# Patient Record
Sex: Female | Born: 1985
Health system: Southern US, Community
[De-identification: ages and names within clinical notes are randomized; demographics above are authoritative.]

---

## 2017-07-23 DIAGNOSIS — B002 Herpesviral gingivostomatitis and pharyngotonsillitis: Secondary | ICD-10-CM

## 2017-07-23 NOTE — Progress Notes (Signed)
Oral herpes outbreak

## 2019-03-22 IMAGING — DX DG CHEST 2V
2 series · 2 of 2 positions shown · non-contrast
Comparison: None.

CLINICAL DATA: Chest tightness and mild shortness of breath. Known
lupus.

EXAM:
CHEST - 2 VIEW

[chest pa]
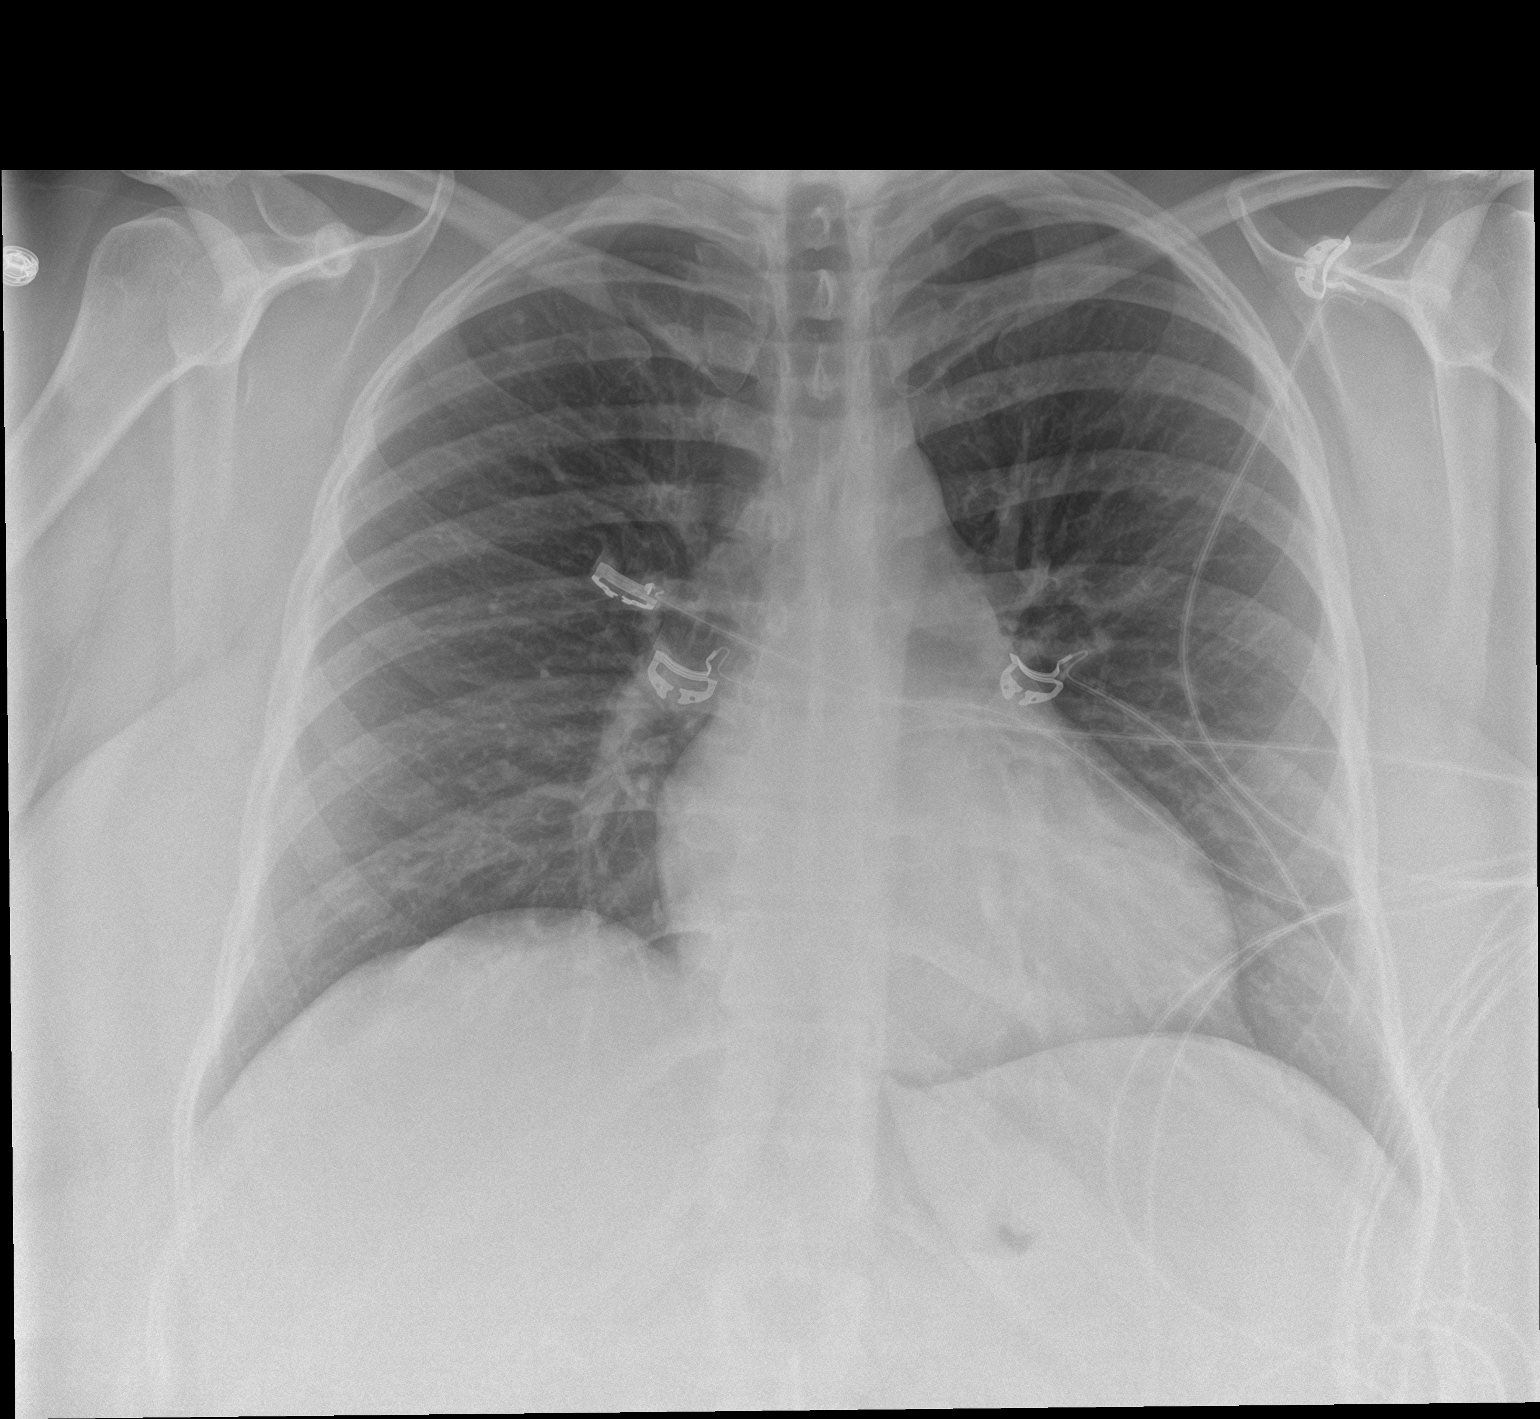

[chest lat]
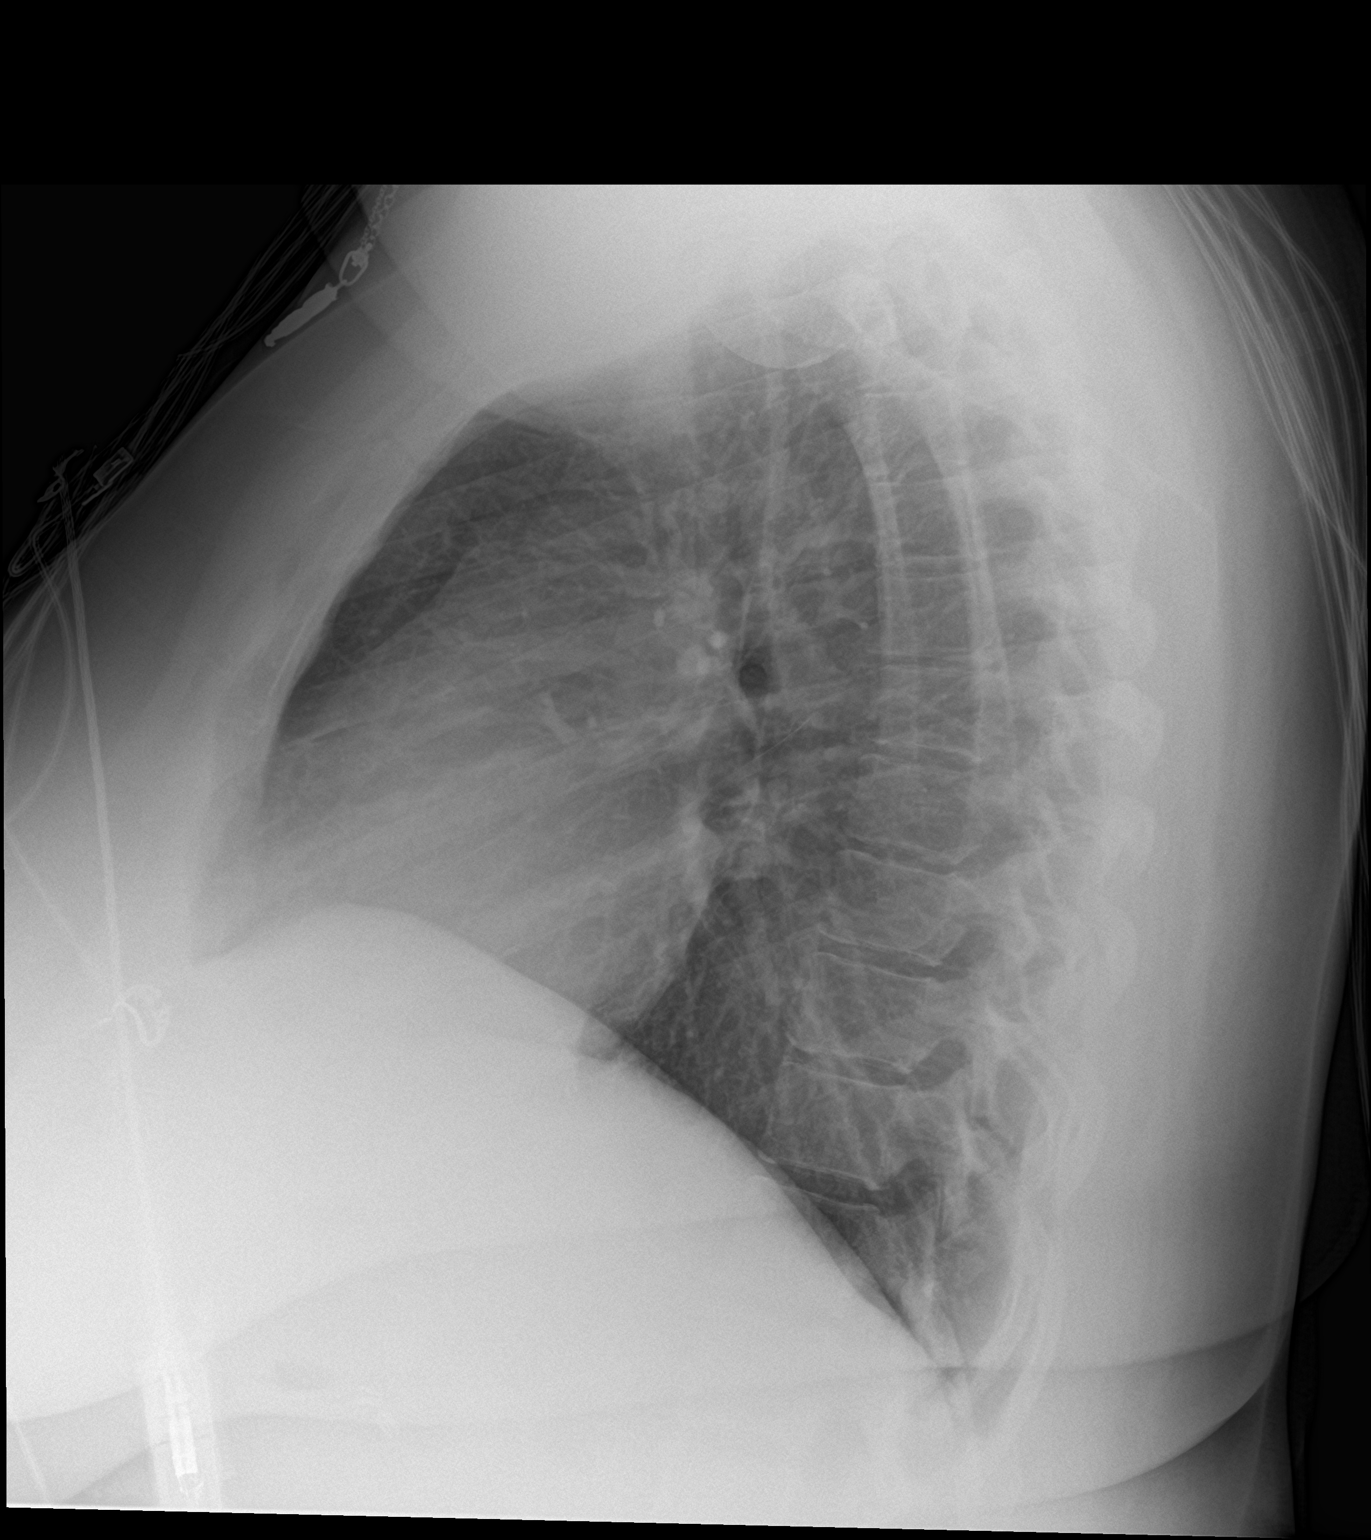

[2 of 2 positions shown; findings below may reference images not displayed]

FINDINGS: The heart size and mediastinal contours are within normal limits.
Both lungs are clear. The visualized skeletal structures are
unremarkable.
IMPRESSION: No active cardiopulmonary disease.

## 2019-03-22 IMAGING — US US EXTREM LOW VENOUS*L*
1 series · 13 of 24 positions shown · non-contrast
Comparison: None.

CLINICAL DATA: Left thigh pain, edema and erythema.



[Series 1: us extrem low venous*left* · 13 of 40 slices shown]
[im 1/40]
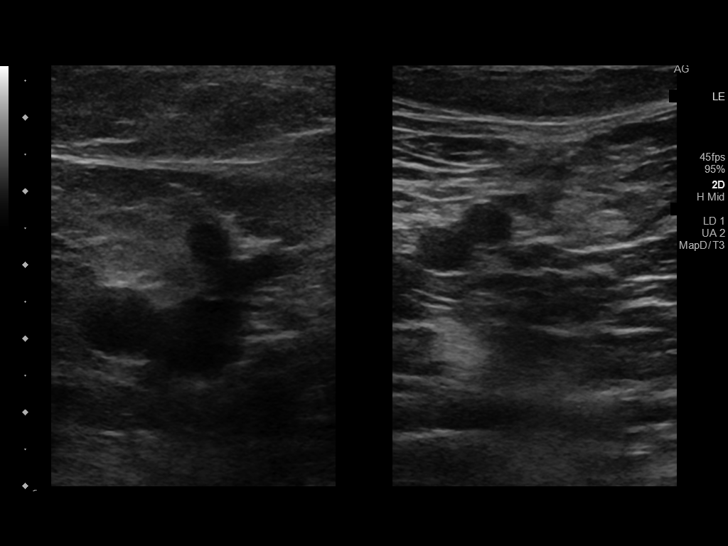
[im 4/40]
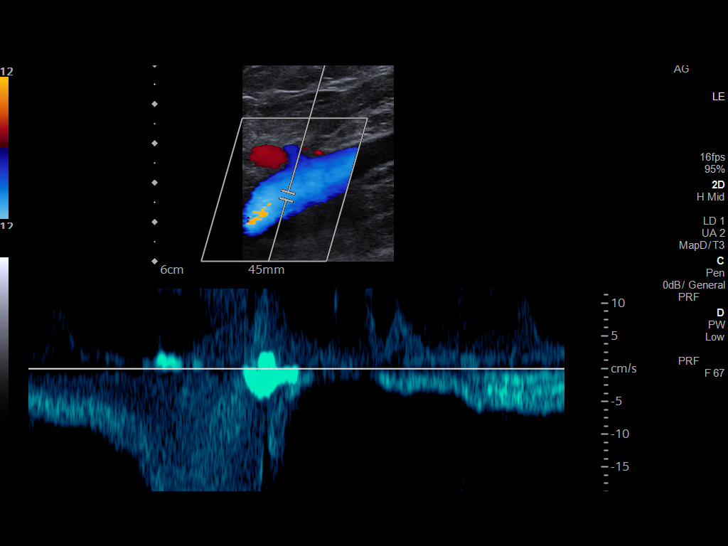
[im 7/40]
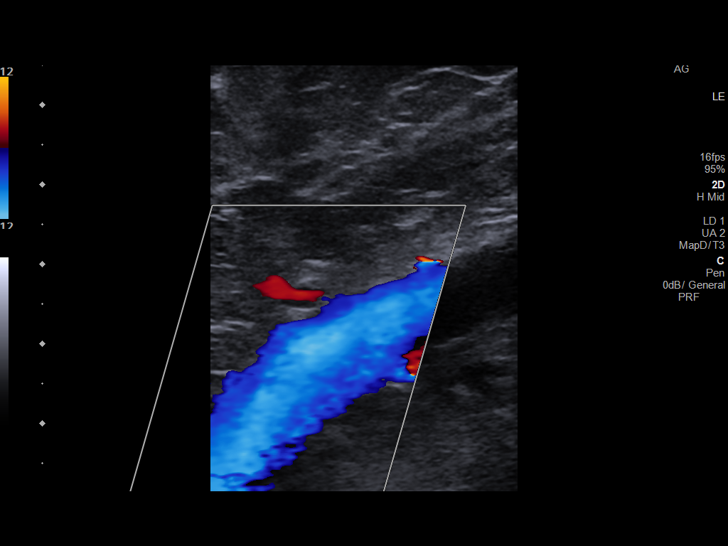
[im 11/40]
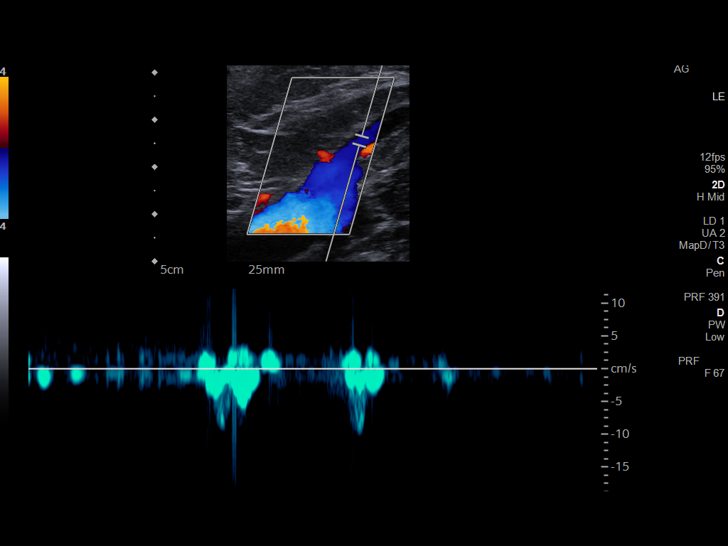
[im 14/40]
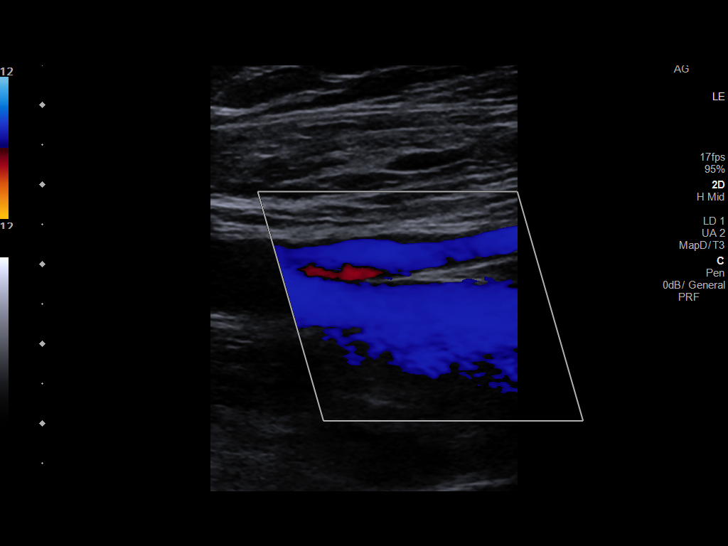
[im 17/40]
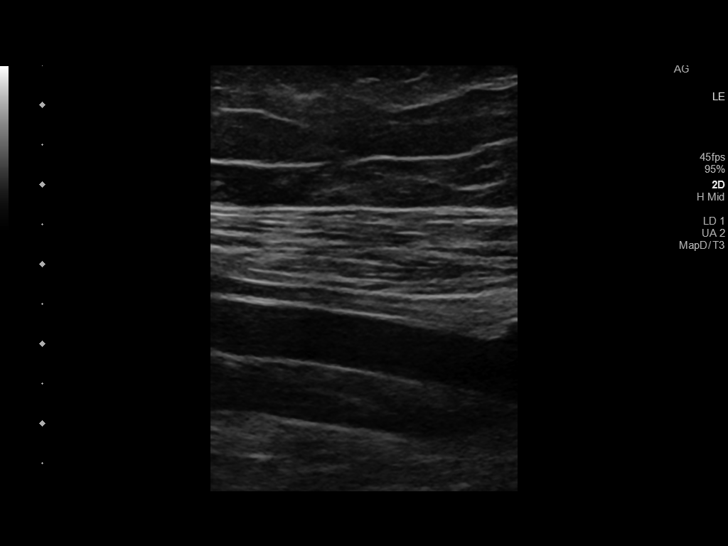
[im 21/40]
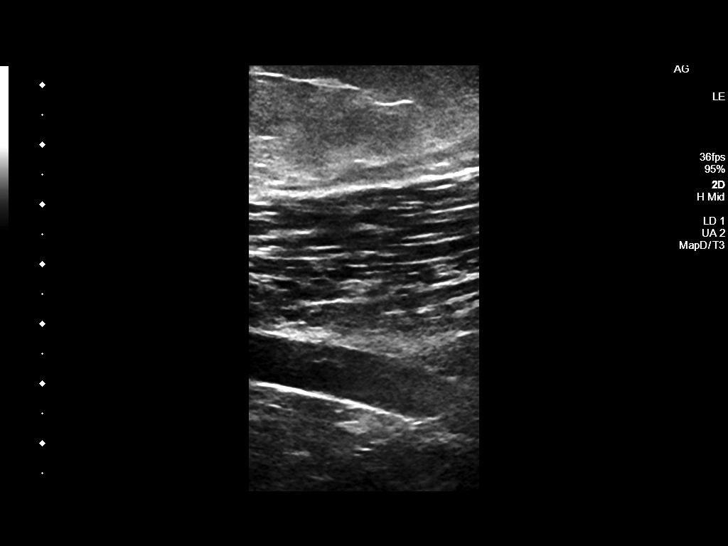
[im 23/40]
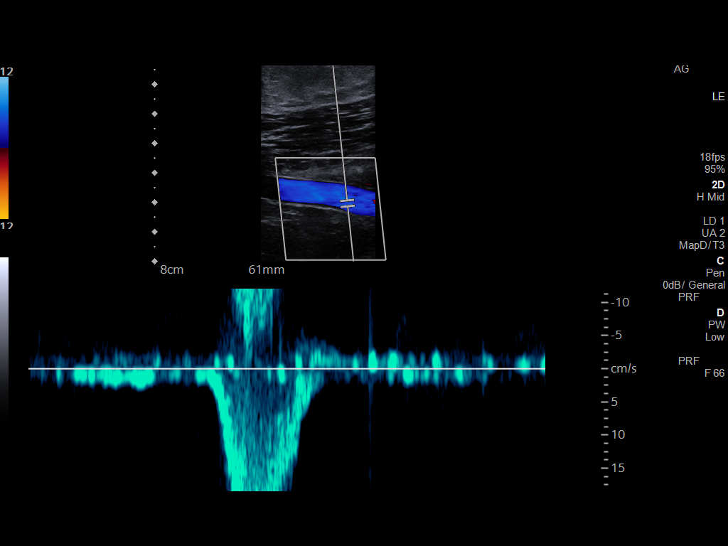
[im 26/40]
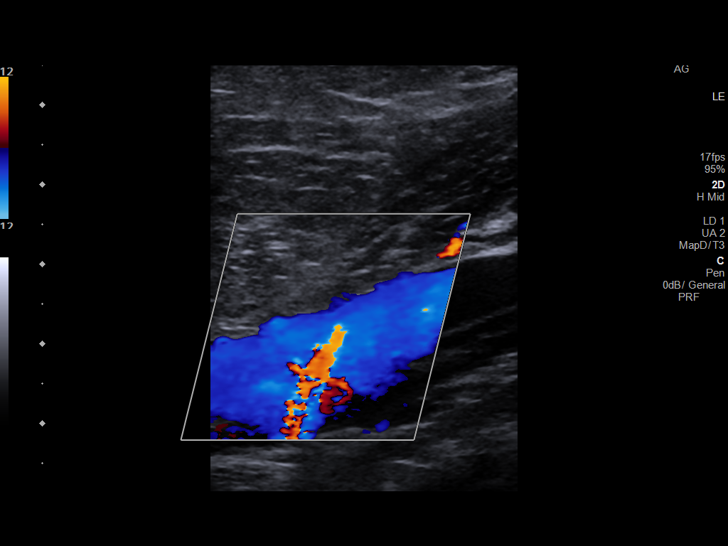
[im 29/40]
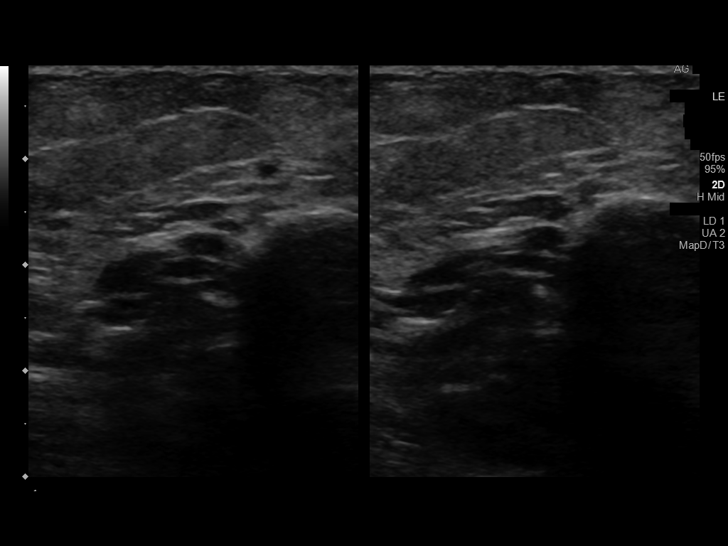
[im 33/40]
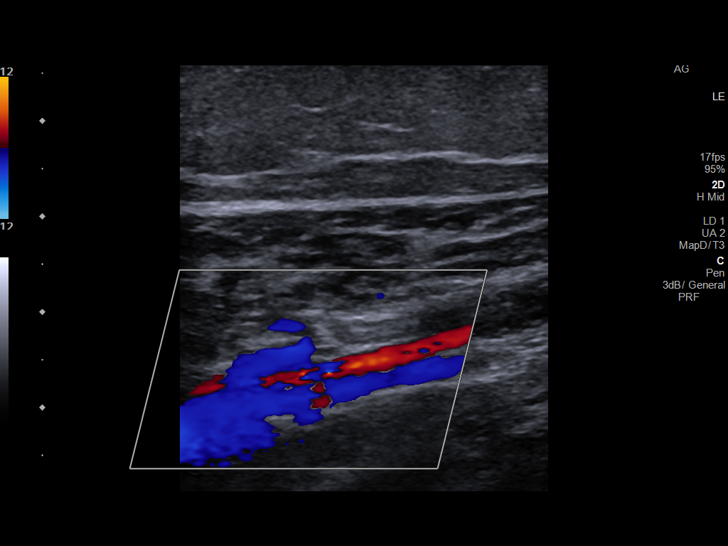
[im 36/40]
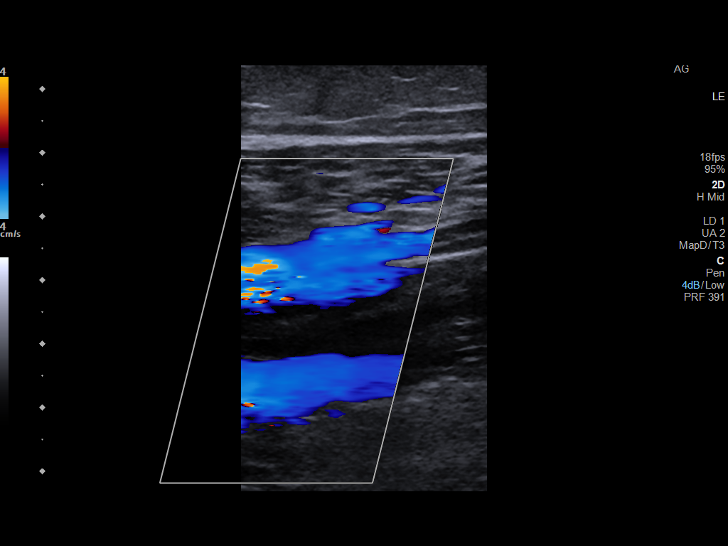
[im 40/40]
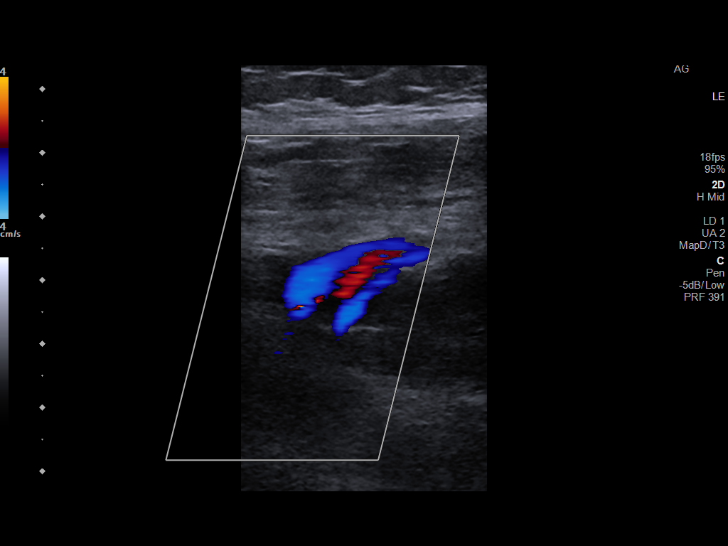

[13 of 24 positions shown; findings below may reference images not displayed]

FINDINGS: Contralateral Common Femoral Vein: Respiratory phasicity is normal
and symmetric with the symptomatic side. No evidence of thrombus.
Normal compressibility.

Common Femoral Vein: No evidence of thrombus. Normal
compressibility, respiratory phasicity and response to augmentation.

Saphenofemoral Junction: No evidence of thrombus. Normal
compressibility and flow on color Doppler imaging.

Profunda Femoral Vein: No evidence of thrombus. Normal
compressibility and flow on color Doppler imaging.

Femoral Vein: No evidence of thrombus. Normal compressibility,
respiratory phasicity and response to augmentation.

Popliteal Vein: No evidence of thrombus. Normal compressibility,
respiratory phasicity and response to augmentation.

Calf Veins: No evidence of thrombus. Normal compressibility and flow
on color Doppler imaging.

Superficial Great Saphenous Vein: No evidence of thrombus. Normal
compressibility.

Venous Reflux:  None.

Other Findings: No evidence of superficial thrombophlebitis or
abnormal fluid collection.
IMPRESSION: No evidence of left lower extremity deep venous thrombosis.
